# Patient Record
Sex: Female | Born: 1935 | Race: White | Hispanic: No | Marital: Single
Health system: Southern US, Community
[De-identification: ages and names within clinical notes are randomized; demographics above are authoritative.]

---

## 2020-07-03 ENCOUNTER — Emergency Department (HOSPITAL_COMMUNITY): Payer: Self-pay

## 2020-07-03 ENCOUNTER — Encounter (HOSPITAL_COMMUNITY): Payer: Self-pay | Admitting: Emergency Medicine

## 2020-07-03 ENCOUNTER — Observation Stay (HOSPITAL_COMMUNITY)
Admission: EM | Admit: 2020-07-03 | Discharge: 2020-07-05 | Disposition: A | Discharge status: Home / Self Care | Payer: Self-pay | Attending: Internal Medicine | Admitting: Internal Medicine

## 2020-07-03 DIAGNOSIS — R413 Other amnesia: Secondary | ICD-10-CM | POA: Diagnosis present

## 2020-07-03 DIAGNOSIS — Z20822 Contact with and (suspected) exposure to covid-19: Secondary | ICD-10-CM | POA: Insufficient documentation

## 2020-07-03 DIAGNOSIS — F1010 Alcohol abuse, uncomplicated: Secondary | ICD-10-CM | POA: Insufficient documentation

## 2020-07-03 DIAGNOSIS — G454 Transient global amnesia: Principal | ICD-10-CM | POA: Insufficient documentation

## 2020-07-03 DIAGNOSIS — F419 Anxiety disorder, unspecified: Secondary | ICD-10-CM | POA: Insufficient documentation

## 2020-07-03 DIAGNOSIS — F1910 Other psychoactive substance abuse, uncomplicated: Secondary | ICD-10-CM | POA: Insufficient documentation

## 2020-07-03 NOTE — ED Triage Notes (Signed)
Pt came in via EMS with c/o "feeling as if something is not right". Pt states she does not know her name and states "my vagina does not feel right". Pt states that her pants are wet. She states that "everything is a blank right now"

## 2020-07-04 ENCOUNTER — Encounter (HOSPITAL_COMMUNITY): Payer: Self-pay | Admitting: Emergency Medicine

## 2020-07-04 ENCOUNTER — Observation Stay (HOSPITAL_COMMUNITY): Payer: Self-pay

## 2020-07-04 ENCOUNTER — Other Ambulatory Visit: Payer: Self-pay

## 2020-07-04 ENCOUNTER — Emergency Department (HOSPITAL_COMMUNITY): Payer: Self-pay

## 2020-07-04 DIAGNOSIS — E876 Hypokalemia: Secondary | ICD-10-CM

## 2020-07-04 DIAGNOSIS — R413 Other amnesia: Secondary | ICD-10-CM

## 2020-07-04 LAB — I-STAT CHEM 8, ED
BUN: 10 mg/dL (ref 8–23)
Calcium, Ion: 1.08 mmol/L — ABNORMAL LOW (ref 1.15–1.40)
Chloride: 104 mmol/L (ref 98–111)
Creatinine, Ser: 0.8 mg/dL (ref 0.44–1.00)
Glucose, Bld: 101 mg/dL — ABNORMAL HIGH (ref 70–99)
HCT: 38 % (ref 36.0–46.0)
Hemoglobin: 12.9 g/dL (ref 12.0–15.0)
Potassium: 3.3 mmol/L — ABNORMAL LOW (ref 3.5–5.1)
Sodium: 142 mmol/L (ref 135–145)
TCO2: 26 mmol/L (ref 22–32)

## 2020-07-04 LAB — CBC WITH DIFFERENTIAL/PLATELET
Abs Immature Granulocytes: 0.02 10*3/uL (ref 0.00–0.07)
Basophils Absolute: 0.1 10*3/uL (ref 0.0–0.1)
Basophils Relative: 1 %
Eosinophils Absolute: 0.1 10*3/uL (ref 0.0–0.5)
Eosinophils Relative: 2 %
HCT: 36.7 % (ref 36.0–46.0)
Hemoglobin: 12.2 g/dL (ref 12.0–15.0)
Immature Granulocytes: 0 %
Lymphocytes Relative: 27 %
Lymphs Abs: 1.8 10*3/uL (ref 0.7–4.0)
MCH: 32.9 pg (ref 26.0–34.0)
MCHC: 33.2 g/dL (ref 30.0–36.0)
MCV: 98.9 fL (ref 80.0–100.0)
Monocytes Absolute: 0.6 10*3/uL (ref 0.1–1.0)
Monocytes Relative: 8 %
Neutro Abs: 4.3 10*3/uL (ref 1.7–7.7)
Neutrophils Relative %: 62 %
Platelets: 370 10*3/uL (ref 150–400)
RBC: 3.71 MIL/uL — ABNORMAL LOW (ref 3.87–5.11)
RDW: 14.7 % (ref 11.5–15.5)
WBC: 6.9 10*3/uL (ref 4.0–10.5)
nRBC: 0 % (ref 0.0–0.2)

## 2020-07-04 LAB — URINALYSIS, ROUTINE W REFLEX MICROSCOPIC
Bilirubin Urine: NEGATIVE
Glucose, UA: NEGATIVE mg/dL
Hgb urine dipstick: NEGATIVE
Ketones, ur: NEGATIVE mg/dL
Leukocytes,Ua: NEGATIVE
Nitrite: NEGATIVE
Protein, ur: NEGATIVE mg/dL
Specific Gravity, Urine: 1.015 (ref 1.005–1.030)
pH: 9 — ABNORMAL HIGH (ref 5.0–8.0)

## 2020-07-04 LAB — RAPID URINE DRUG SCREEN, HOSP PERFORMED
Amphetamines: NOT DETECTED
Barbiturates: NOT DETECTED
Benzodiazepines: NOT DETECTED
Cocaine: POSITIVE — AB
Opiates: NOT DETECTED
Tetrahydrocannabinol: POSITIVE — AB

## 2020-07-04 LAB — MAGNESIUM: Magnesium: 2.3 mg/dL (ref 1.7–2.4)

## 2020-07-04 LAB — RESP PANEL BY RT-PCR (FLU A&B, COVID) ARPGX2
Influenza A by PCR: NEGATIVE
Influenza B by PCR: NEGATIVE
SARS Coronavirus 2 by RT PCR: NEGATIVE

## 2020-07-04 LAB — ETHANOL: Alcohol, Ethyl (B): 101 mg/dL — ABNORMAL HIGH (ref <10)

## 2020-07-04 LAB — TSH: TSH: 2.73 u[IU]/mL (ref 0.350–4.500)

## 2020-07-04 LAB — ACETAMINOPHEN LEVEL: Acetaminophen (Tylenol), Serum: <10 ug/mL — ABNORMAL LOW (ref 10–30)

## 2020-07-04 LAB — PHOSPHORUS: Phosphorus: 4.3 mg/dL (ref 2.5–4.6)

## 2020-07-04 LAB — VITAMIN B12: Vitamin B-12: 192 pg/mL (ref 180–914)

## 2020-07-04 LAB — SALICYLATE LEVEL: Salicylate Lvl: <7 mg/dL — ABNORMAL LOW (ref 7.0–30.0)

## 2020-07-04 MED ORDER — THIAMINE HCL 100 MG/ML IJ SOLN: 250.0000 mg | Freq: Every day | INTRAVENOUS | Status: DC

## 2020-07-04 MED ORDER — LORAZEPAM 2 MG/ML IJ SOLN
1.0000 mg | INTRAMUSCULAR | Status: DC | PRN
Start: 2020-07-04 — End: 2020-07-05
  Administered 2020-07-04 (×3): 2 mg via INTRAVENOUS
  Administered 2020-07-05 (×2): 1 mg via INTRAVENOUS
  Filled 2020-07-04 (×5): qty 1

## 2020-07-04 MED ORDER — FOLIC ACID 1 MG PO TABS
1.0000 mg | ORAL_TABLET | Freq: Every day | ORAL | Status: DC
  Administered 2020-07-04 – 2020-07-05 (×2): 1 mg via ORAL
  Filled 2020-07-04 (×2): qty 1

## 2020-07-04 MED ORDER — ACETAMINOPHEN 325 MG PO TABS: 650.0000 mg | ORAL_TABLET | ORAL | Status: DC | PRN

## 2020-07-04 MED ORDER — SODIUM CHLORIDE 0.9 % IV SOLN
75.0000 mL/h | INTRAVENOUS | Status: DC
  Administered 2020-07-04 – 2020-07-05 (×2): 75 mL/h via INTRAVENOUS

## 2020-07-04 MED ORDER — THIAMINE HCL 100 MG/ML IJ SOLN: 500.0000 mg | Freq: Three times a day (TID) | INTRAVENOUS | Status: DC

## 2020-07-04 MED ORDER — THIAMINE HCL 100 MG/ML IJ SOLN: 100.0000 mg | Freq: Every day | INTRAMUSCULAR | Status: DC

## 2020-07-04 MED ORDER — SODIUM CHLORIDE 0.9 % IV SOLN: Freq: Once | INTRAVENOUS | Status: AC

## 2020-07-04 MED ORDER — THIAMINE HCL 100 MG/ML IJ SOLN
Freq: Once | INTRAVENOUS | Status: AC
  Filled 2020-07-04: qty 1000

## 2020-07-04 MED ORDER — THIAMINE HCL 100 MG/ML IJ SOLN: Freq: Once | INTRAVENOUS | Status: DC

## 2020-07-04 MED ORDER — ADULT MULTIVITAMIN W/MINERALS CH
1.0000 | ORAL_TABLET | Freq: Every day | ORAL | Status: DC
  Administered 2020-07-04 – 2020-07-05 (×2): 1 via ORAL
  Filled 2020-07-04 (×2): qty 1

## 2020-07-04 MED ORDER — ENOXAPARIN SODIUM 40 MG/0.4ML IJ SOSY
40.0000 mg | PREFILLED_SYRINGE | Freq: Every day | INTRAMUSCULAR | Status: DC
  Administered 2020-07-04 – 2020-07-05 (×2): 40 mg via SUBCUTANEOUS
  Filled 2020-07-04 (×2): qty 0.4

## 2020-07-04 MED ORDER — ACETAMINOPHEN 650 MG RE SUPP: 650.0000 mg | RECTAL | Status: DC | PRN

## 2020-07-04 MED ORDER — LORAZEPAM 1 MG PO TABS
1.0000 mg | ORAL_TABLET | ORAL | Status: DC | PRN
  Administered 2020-07-05: 1 mg via ORAL
  Filled 2020-07-04: qty 1

## 2020-07-04 MED ORDER — POTASSIUM CHLORIDE CRYS ER 20 MEQ PO TBCR
40.0000 meq | EXTENDED_RELEASE_TABLET | Freq: Every day | ORAL | Status: DC
  Administered 2020-07-04: 40 meq via ORAL
  Filled 2020-07-04: qty 2

## 2020-07-04 MED ORDER — ONDANSETRON HCL 4 MG/2ML IJ SOLN: 4.0000 mg | Freq: Four times a day (QID) | INTRAMUSCULAR | Status: DC | PRN

## 2020-07-04 MED ORDER — ONDANSETRON HCL 4 MG PO TABS: 4.0000 mg | ORAL_TABLET | Freq: Four times a day (QID) | ORAL | Status: DC | PRN

## 2020-07-04 MED ORDER — THIAMINE HCL 100 MG PO TABS
100.0000 mg | ORAL_TABLET | Freq: Every day | ORAL | Status: DC
  Administered 2020-07-04 – 2020-07-05 (×3): 100 mg via ORAL
  Filled 2020-07-04 (×3): qty 1

## 2020-07-04 MED ORDER — LEVETIRACETAM IN NACL 1000 MG/100ML IV SOLN
1000.0000 mg | Freq: Once | INTRAVENOUS | Status: AC
  Administered 2020-07-04: 1000 mg via INTRAVENOUS
  Filled 2020-07-04: qty 100

## 2020-07-04 NOTE — Progress Notes (Signed)
CSW working remotely and is currently unable to provide patient with in person substance abuse counseling. CSW added substance abuse resources to patient's AVS.  Edwin Dada, MSW, LCSW Transitions of Care  Clinical Social Worker II [phone]
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
## 2020-07-04 NOTE — ED Provider Notes (Addendum)
[facility]-EMERGENCY DEPT Provider Note   CSN: [id number] Arrival date & time: 07/03/20  2239     History Chief Complaint  Patient presents with  . Altered Mental Status    Jamie Moran is a 87 y.o. female.  The history is provided by the patient and the police. The history is limited by the condition of the patient.  Altered Mental Status Presenting symptoms: memory loss   Presenting symptoms comment:  Does not know name birth date, town, date, president.  She is able to say that this place is a hospital and recognizes objects  Severity:  Severe Most recent episode: unknown duration. Episode history:  Single Timing:  Constant Progression:  Unchanged Chronicity:  New Context comment:  Unknown Associated symptoms: rash   Associated symptoms: no abdominal pain, normal movement, no agitation, no difficulty breathing, no fever, no headaches, no nausea, no slurred speech, no visual change, no vomiting and no weakness   Patient does not know who she is or the town or any dates.  Denies all medical symptoms to me.  Is frustrated that we continue to ask her her name and other questions.       History reviewed. No pertinent past medical history.  There are no problems to display for this patient.   History reviewed. No pertinent surgical history.   OB History   No obstetric history on file.     History reviewed. No pertinent family history.     Home Medications Prior to Admission medications   Not on File    Allergies    Patient has no known allergies.  Review of Systems   Review of Systems  Constitutional: Negative for fever.  HENT: Negative for facial swelling.   Respiratory: Negative for wheezing.   Cardiovascular: Negative for leg swelling.  Gastrointestinal: Negative for abdominal pain, nausea and vomiting.  Musculoskeletal: Negative for neck stiffness.  Skin: Positive for rash.  Neurological: Negative for weakness and  headaches.  Psychiatric/Behavioral: Positive for memory loss. Negative for agitation.    Physical Exam Updated Vital Signs BP (!) 144/86   Pulse 63   Temp 98.9 F (37.2 C) (Oral)   Resp 18   Wt 63.1 kg   SpO2 99%   Physical Exam Vitals and nursing note reviewed.  Constitutional:      Appearance: Normal appearance. She is not ill-appearing or diaphoretic.  HENT:     Head: Normocephalic and atraumatic.     Right Ear: Tympanic membrane normal.     Left Ear: Tympanic membrane normal.     Mouth/Throat:     Mouth: Mucous membranes are moist.     Pharynx: Oropharynx is clear.  Eyes:     Extraocular Movements: Extraocular movements intact.     Conjunctiva/sclera: Conjunctivae normal.     Pupils: Pupils are equal, round, and reactive to light.  Cardiovascular:     Rate and Rhythm: Normal rate and regular rhythm.     Pulses: Normal pulses.     Heart sounds: Normal heart sounds.  Pulmonary:     Effort: Pulmonary effort is normal.     Breath sounds: Normal breath sounds.  Abdominal:     General: Abdomen is flat. Bowel sounds are normal.     Palpations: Abdomen is soft.     Tenderness: There is no abdominal tenderness. There is no guarding.  Musculoskeletal:        General: Normal range of motion.     Cervical back: Normal range of  motion and neck supple. No rigidity.  Lymphadenopathy:     Cervical: No cervical adenopathy.  Skin:    General: Skin is warm and dry.     Capillary Refill: Capillary refill takes less than 2 seconds.       Neurological:     Mental Status: She is alert.     Cranial Nerves: No cranial nerve deficit.     Deep Tendon Reflexes: Reflexes normal.  Psychiatric:        Mood and Affect: Mood normal.     ED Results / Procedures / Treatments   Labs (all labs ordered are listed, but only abnormal results are displayed) Results for orders placed or performed during the hospital encounter of 07/03/20  CBC with Differential/Platelet  Result Value Ref  Range   WBC 6.9 4.0 - 10.5 K/uL   RBC 3.71 (L) 3.87 - 5.11 MIL/uL   Hemoglobin 12.2 12.0 - 15.0 g/dL   HCT 08.1 44.8 - 18.5 %   MCV 98.9 80.0 - 100.0 fL   MCH 32.9 26.0 - 34.0 pg   MCHC 33.2 30.0 - 36.0 g/dL   RDW 63.1 49.7 - 02.6 %   Platelets 370 150 - 400 K/uL   nRBC 0.0 0.0 - 0.2 %   Neutrophils Relative % 62 %   Neutro Abs 4.3 1.7 - 7.7 K/uL   Lymphocytes Relative 27 %   Lymphs Abs 1.8 0.7 - 4.0 K/uL   Monocytes Relative 8 %   Monocytes Absolute 0.6 0.1 - 1.0 K/uL   Eosinophils Relative 2 %   Eosinophils Absolute 0.1 0.0 - 0.5 K/uL   Basophils Relative 1 %   Basophils Absolute 0.1 0.0 - 0.1 K/uL   Immature Granulocytes 0 %   Abs Immature Granulocytes 0.02 0.00 - 0.07 K/uL  I-stat chem 8, ED (not at [facility] or [facility])  Result Value Ref Range   Sodium 142 135 - 145 mmol/L   Potassium 3.3 (L) 3.5 - 5.1 mmol/L   Chloride 104 98 - 111 mmol/L   BUN 10 8 - 23 mg/dL   Creatinine, Ser 3.78 0.44 - 1.00 mg/dL   Glucose, Bld 588 (H) 70 - 99 mg/dL   Calcium, Ion 5.02 (L) 1.15 - 1.40 mmol/L   TCO2 26 22 - 32 mmol/L   Hemoglobin 12.9 12.0 - 15.0 g/dL   HCT 77.4 12.8 - 78.6 %   CT Head Wo Contrast  Result Date: 07/04/2020 CLINICAL DATA:  Delirium EXAM: CT HEAD WITHOUT CONTRAST TECHNIQUE: Contiguous axial images were obtained from the base of the skull through the vertex without intravenous contrast. COMPARISON:  None. FINDINGS: Brain: No evidence of acute infarction, hemorrhage, hydrocephalus, extra-axial collection or mass lesion/mass effect. Vascular: No hyperdense vessel or unexpected calcification. Mild carotid vascular calcification Skull: Normal. Negative for fracture or focal lesion. Sinuses/Orbits: No acute finding. Other: None IMPRESSION: Negative non contrasted CT appearance of the brain Electronically Signed   By: Jasmine Pang M.D.   On: 07/04/2020 00:04   DG Chest Portable 1 View  Result Date: 07/04/2020 CLINICAL DATA:  Altered level of consciousness EXAM: PORTABLE CHEST 1 VIEW  COMPARISON:  None. FINDINGS: The heart size and mediastinal contours are within normal limits. Both lungs are clear. The visualized skeletal structures are unremarkable. IMPRESSION: No active disease. Electronically Signed   By: Sharlet Salina M.D.   On: 07/04/2020 03:46    EKG None  Radiology CT Head Wo Contrast  Result Date: 07/04/2020 CLINICAL DATA:  Delirium EXAM: CT HEAD  WITHOUT CONTRAST TECHNIQUE: Contiguous axial images were obtained from the base of the skull through the vertex without intravenous contrast. COMPARISON:  None. FINDINGS: Brain: No evidence of acute infarction, hemorrhage, hydrocephalus, extra-axial collection or mass lesion/mass effect. Vascular: No hyperdense vessel or unexpected calcification. Mild carotid vascular calcification Skull: Normal. Negative for fracture or focal lesion. Sinuses/Orbits: No acute finding. Other: None IMPRESSION: Negative non contrasted CT appearance of the brain Electronically Signed   By: Jasmine Pang M.D.   On: 07/04/2020 00:04    Procedures Procedures   Medications Ordered in ED Medications  dextrose 5 % and 0.45% NaCl 1,000 mL with thiamine 500 mg, folic acid 1 mg, multivitamins adult 10 mL, magnesium sulfate 2 g infusion (has no administration in time range)  levETIRAcetam (KEPPRA) IVPB 1000 mg/100 mL premix (0 mg Intravenous Stopped 07/04/20 0502)    ED Course  I have reviewed the triage vital signs and the nursing notes.  Pertinent labs & imaging results that were available during my care of the patient were reviewed by me and considered in my medical decision making (see chart for details).   Police performed facial recognition and finger printing and were unable to identify patient.     320 case d/w Dr. Derry Lory.  Admit to [facility], MRI in the am.  Load with 1000 mg of keppra and may need continuous EEG monitoring.     410 case d/w Dr. Loney Loh who will see the patient for admission at [facility]   Final Clinical Impression(s) / ED  Diagnoses Final diagnoses:  Transient global amnesia   Admit to medicine at [facility]   Heru Montz, MD 07/04/20 1607    Nicanor Alcon, Karthika Glasper, MD 07/04/20 3710

## 2020-07-04 NOTE — H&P (Signed)
History and Physical    Jamie Moran AUQ:[id number] DOB: 08/19/1872 DOA: 07/03/2020  PCP: Pcp, No  Patient coming from: streets  Chief Complaint: amnesia  HPI: This patient is a female of undetermined age and medical history. She presents with altered mental status and amnesia. She is unable to tell us her name or what happened prior to her arrival at the hospital. She is aware that she is in [place], [place]. However, she is unable to tell us much more than that. She was found by police in a disheveled state. They tried to perform facial recognition and finger print searches; however, they were unsuccessful in identifying the patient.   ED Course: EDP spoke with neurology. There was concern for possible seizure. She was started on keppra. It was recommended that she get and MRI and EEG at [facility]. [facility] was called for admission.  Review of Systems:  Denies CP, dyspnea, ab pain, palpitations, N/V/D, fever. Review of systems is otherwise negative for all not mentioned in HPI.   PMHx Unable to obtain d/t mentation.  PSHx Unable to obtain d/t mentation.  SocHx Unable to obtain d/t mentation; although her UDS is positive for cocaine and marijuana  No Known Allergies  FamHx Unable to obtain d/t mentation.  Prior to Admission medications   Not on File    Physical Exam: Vitals:   07/04/20 0515 07/04/20 0618 07/04/20 0700 07/04/20 0800  BP: (!) 109/92 133/89 138/80 (!) 150/80  Pulse: 68 67 62 (!) 55  Resp: 19 18 18 16   Temp:      TempSrc:      SpO2: 98% 100% 100% 98%  Weight:        General: 87 y.o. female resting in bed in NAD Eyes: PERRL, normal sclera ENMT: Nares patent w/o discharge, orophaynx clear, dentition normal, ears w/o discharge/lesions/ulcers Neck: Supple, trachea midline Cardiovascular: RRR, +S1, S2, no m/g/r, equal pulses throughout Respiratory: CTABL, no w/r/r, normal WOB GI: BS+, NDNT, no masses noted, no organomegaly noted MSK: No e/c/c Skin: No rashes,  bruises, ulcerations noted Neuro: no focal deficits; alert to city; does not know name or year Psyc: Odd affect, calm/cooperative  Labs on Admission: I have personally reviewed following labs and imaging studies  CBC: Recent Labs  Lab 07/04/20 0030 07/04/20 0036  WBC 6.9  --   NEUTROABS 4.3  --   HGB 12.2 12.9  HCT 36.7 38.0  MCV 98.9  --   PLT 370  --    Basic Metabolic Panel: Recent Labs  Lab 07/04/20 0036  NA 142  K 3.3*  CL 104  GLUCOSE 101*  BUN 10  CREATININE 0.80   GFR: CrCl cannot be calculated (Unknown ideal weight.). Liver Function Tests: No results for input(s): AST, ALT, ALKPHOS, BILITOT, PROT, ALBUMIN in the last 168 hours. No results for input(s): LIPASE, AMYLASE in the last 168 hours. No results for input(s): AMMONIA in the last 168 hours. Coagulation Profile: No results for input(s): INR, PROTIME in the last 168 hours. Cardiac Enzymes: No results for input(s): CKTOTAL, CKMB, CKMBINDEX, TROPONINI in the last 168 hours. BNP (last 3 results) No results for input(s): PROBNP in the last 8760 hours. HbA1C: No results for input(s): HGBA1C in the last 72 hours. CBG: No results for input(s): GLUCAP in the last 168 hours. Lipid Profile: No results for input(s): CHOL, HDL, LDLCALC, TRIG, CHOLHDL, LDLDIRECT in the last 72 hours. Thyroid Function Tests: No results for input(s): TSH, T4TOTAL, FREET4, T3FREE, THYROIDAB in the last 72  hours. Anemia Panel: No results for input(s): VITAMINB12, FOLATE, FERRITIN, TIBC, IRON, RETICCTPCT in the last 72 hours. Urine analysis:    Component Value Date/Time   COLORURINE YELLOW 07/04/2020 0400   APPEARANCEUR CLOUDY (A) 07/04/2020 0400   LABSPEC 1.015 07/04/2020 0400   PHURINE 9.0 (H) 07/04/2020 0400   GLUCOSEU NEGATIVE 07/04/2020 0400   HGBUR NEGATIVE 07/04/2020 0400   BILIRUBINUR NEGATIVE 07/04/2020 0400   KETONESUR NEGATIVE 07/04/2020 0400   PROTEINUR NEGATIVE 07/04/2020 0400   NITRITE NEGATIVE 07/04/2020 0400    LEUKOCYTESUR NEGATIVE 07/04/2020 0400    Radiological Exams on Admission: CT Head Wo Contrast  Result Date: 07/04/2020 CLINICAL DATA:  Delirium EXAM: CT HEAD WITHOUT CONTRAST TECHNIQUE: Contiguous axial images were obtained from the base of the skull through the vertex without intravenous contrast. COMPARISON:  None. FINDINGS: Brain: No evidence of acute infarction, hemorrhage, hydrocephalus, extra-axial collection or mass lesion/mass effect. Vascular: No hyperdense vessel or unexpected calcification. Mild carotid vascular calcification Skull: Normal. Negative for fracture or focal lesion. Sinuses/Orbits: No acute finding. Other: None IMPRESSION: Negative non contrasted CT appearance of the brain Electronically Signed   By: Jasmine Pang M.D.   On: 07/04/2020 00:04   DG Chest Portable 1 View  Result Date: 07/04/2020 CLINICAL DATA:  Altered level of consciousness EXAM: PORTABLE CHEST 1 VIEW COMPARISON:  None. FINDINGS: The heart size and mediastinal contours are within normal limits. Both lungs are clear. The visualized skeletal structures are unremarkable. IMPRESSION: No active disease. Electronically Signed   By: Sharlet Salina M.D.   On: 07/04/2020 03:46    EKG: None obtained in ED.  Assessment/Plan Acute metabolic encephalopathy Amnesia     - place in obs, tele     - neuro onboard, appreciate assistance     - received keppra load; will defer further antiepileptic use to neuro     - MRI ordered     - likely related to polysubstance abuse  Cocaine abuse Marijuana abuse EtOH abuse     - UDS positive for above     - counsel against further use     - not sure of how much EtOH she uses or withdrawal history, will start CIWA  Hypokalemia     - replace K+, check Mg2+  DVT prophylaxis: SCDs  Code Status: FULL  Family Communication: None at bedside  Consults called: EDP spoke with neurology  Status is: Observation  The patient remains OBS appropriate and will d/c before 2  midnights.  Dispo: The patient is from: Unknown              Anticipated d/c is to: Home              Patient currently is not medically stable to d/c.   Difficult to place patient No  Time spent coordinating admission: 60 minutes  [name] A Kamika Goodloe DO [facility]  If 7PM-7AM, please contact night-coverage [email or web address]  07/04/2020, 8:46 AM

## 2020-07-04 NOTE — Progress Notes (Signed)
Paged SANE nurse Traci. She will call me back as soon as she is finished evaluating another patient.Priscille Kluver

## 2020-07-04 NOTE — Discharge Instructions (Signed)
Finding Treatment for Addiction Addiction is a complex disease of the brain that causes an uncontrollable (compulsive) need for:  A substance. This includes alcohol, illegal drugs, or prescription medicines, such as painkillers.  An activity or behavior, such as gambling or shopping. Addiction changes the way your brain works. Because of this change:  The need for the medicine, drug, or activity can become so strong that you think about it all the time.  Getting more and more of your addiction becomes the most important thing to you.  You may find yourself leaving other activities and relationships to pursue your addiction.  You can become physically dependent on a substance.  Your health, behavior, emotions, and relationships can change for the worse. How do I know if I need treatment for addiction? Addiction is a progressive disease. Without treatment, addiction can get worse. Living with addiction puts you at higher risk for injury, poor health, loss of employment, loss of money, and even death. You might need treatment for addiction if:  You have tried to stop or cut down, but you have not succeeded.  You find it annoying that your friends and family are concerned about your use or behavior.  You feel guilty about your use or behavior.  You need a particular substance or activity to start your day or to calm down.  You are running out of money because of your addiction.  You have done something illegal to support your addiction.  Your addiction has caused you: ? Health problems. ? Trouble in school, work, home, or with the police. ? To devote all your time to your addiction, and not to other responsibilities. ? To tell lies in order to hide your problem. What types of treatment are available? There may be options for treatment programs and plans based on your addiction, condition, needs, and preferences. No single treatment is right for everyone.  Treatment programs can  be: ? Outpatient. You live at home and go to work or school, but you go to a clinic for treatment. ? Inpatient. You live and sleep at the program facility during treatment.  Programs may include: ? Medicine. You may need medicine to treat the addiction itself, or to treat anxiety or depression. ? Counseling and behavior therapy. This can help individuals and families behave in healthier ways and relate more effectively. ? Support groups. Confidential group therapy, such as a 12-step program, can help individuals and families during treatment and recovery. ? A combination of education, counseling, and a 12-step, spirituality-based approach.   What should I consider when selecting a treatment program? Think about your individual requirements when selecting a treatment program. Ask about:  The overall approach to treatment. ? Some programs are strictly 12-step programs. Some have a more flexible approach. ? Programs may differ in length of stay, setting, and size. ? Some programs include your family in your treatment plan. Support may be offered to them throughout the treatment process, as well as instructions for them when you are discharged. ? You may continue to receive support after you have left the program.  The types of medical services that are offered. Find out if the program: ? Offers specific treatment for your particular addiction. ? Meets all of your needs, including physical and cultural needs. ? Includes any medicines you might need. ? Offers mental health counseling as part of your treatment. ? Offers the 12-step meetings at the center, or if transport is available for patients to attend meetings at other locations.  The cost and types of insurance that are accepted. ? Some programs are sponsored by the government. They support patients who do not have private insurance. ? If you do not have insurance, or if you choose to attend a program that does not accept your insurance,  call the treatment center. Tell them your financial needs and whether a payment plan can be set up. ? There are also organizations that will help you find the resources for treatment. You can find them online by searching "treatment for addiction."  If the program is certified by the appropriate government agency. Where to find support  Your health care provider can help you to find the right treatment. These discussions are confidential.  The [employer] on Alcoholism and Drug Dependence (NCADD). This group has information about treatment centers and programs for people who have an addiction and for family members. ? Call: 1-800-NCA-CALL ((807)577-1452). ? Visit the website: https://www.ncadd.org/  The Substance Abuse and Mental Health Services Administration [facility]). This organization will help you find publicly funded treatment centers, help hotlines, and counseling services near you. ? Call: 1-800-662-HELP ((954)398-0494). ? Visit the website: www.findtreatment.RockToxic.pl  The National Problem Gambling Helpline. This is a 24-hour confidential helpline for gambling addiction. ? Call: 915 543 5218 ? Visit the website: CocoaInvestor.tn In countries outside of the [place] and [place], look in [employer] for contact information for services in your area. Follow these instructions at home:  Find supportive people who will help you stay away from your addiction and stay sober.  Do not use the substance or engage in the activity.  If you have been through treatment: ? Follow your plan. The plan is usually developed by you and your health care provider during treatment. ? Go to meetings with other people in recovery. ? Avoid people, situations, and things that lead you to do the things you are addicted to (triggers). Summary  Addiction changes the way your brain works. These changes cause a desire to repeat and increase the use of the a substance or  behavior.  Addiction is a progressive disease. Without treatment, addiction can get worse. Living with addiction puts you at higher risk for injury, poor health, loss of employment, loss of money, and even death.  There may be options for treatment programs and plans based on your addiction, condition, needs, and preferences. No single treatment is right for everyone.  Your health care provider can help you to find the right treatment. These discussions are confidential. This information is not intended to replace advice given to you by your health care provider. Make sure you discuss any questions you have with your health care provider. Document Revised: 10/17/2018 Document Reviewed: 02/22/2017 Elsevier Patient Education  2021 [employer].
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
## 2020-07-05 LAB — CBC WITH DIFFERENTIAL/PLATELET
Abs Immature Granulocytes: 0.01 10*3/uL (ref 0.00–0.07)
Basophils Absolute: 0.1 10*3/uL (ref 0.0–0.1)
Basophils Relative: 1 %
Eosinophils Absolute: 0.1 10*3/uL (ref 0.0–0.5)
Eosinophils Relative: 2 %
HCT: 35.6 % — ABNORMAL LOW (ref 36.0–46.0)
Hemoglobin: 11.7 g/dL — ABNORMAL LOW (ref 12.0–15.0)
Immature Granulocytes: 0 %
Lymphocytes Relative: 22 %
Lymphs Abs: 1 10*3/uL (ref 0.7–4.0)
MCH: 32.7 pg (ref 26.0–34.0)
MCHC: 32.9 g/dL (ref 30.0–36.0)
MCV: 99.4 fL (ref 80.0–100.0)
Monocytes Absolute: 0.4 10*3/uL (ref 0.1–1.0)
Monocytes Relative: 9 %
Neutro Abs: 2.9 10*3/uL (ref 1.7–7.7)
Neutrophils Relative %: 66 %
Platelets: 336 10*3/uL (ref 150–400)
RBC: 3.58 MIL/uL — ABNORMAL LOW (ref 3.87–5.11)
RDW: 14.6 % (ref 11.5–15.5)
WBC: 4.5 10*3/uL (ref 4.0–10.5)
nRBC: 0 % (ref 0.0–0.2)

## 2020-07-05 LAB — GLUCOSE, CAPILLARY
Glucose-Capillary: 138 mg/dL — ABNORMAL HIGH (ref 70–99)
Glucose-Capillary: 89 mg/dL (ref 70–99)
Glucose-Capillary: 79 mg/dL (ref 70–99)

## 2020-07-05 LAB — COMPREHENSIVE METABOLIC PANEL
ALT: 11 U/L (ref 0–44)
AST: 12 U/L — ABNORMAL LOW (ref 15–41)
Albumin: 3.1 g/dL — ABNORMAL LOW (ref 3.5–5.0)
Alkaline Phosphatase: 69 U/L (ref 38–126)
Anion gap: 4 — ABNORMAL LOW (ref 5–15)
BUN: 10 mg/dL (ref 8–23)
CO2: 23 mmol/L (ref 22–32)
Calcium: 8.3 mg/dL — ABNORMAL LOW (ref 8.9–10.3)
Chloride: 110 mmol/L (ref 98–111)
Creatinine, Ser: 0.57 mg/dL (ref 0.44–1.00)
GFR, Estimated: >60 mL/min (ref >60)
Glucose, Bld: 89 mg/dL (ref 70–99)
Potassium: 3.9 mmol/L (ref 3.5–5.1)
Sodium: 137 mmol/L (ref 135–145)
Total Bilirubin: 0.4 mg/dL (ref 0.3–1.2)
Total Protein: 5.8 g/dL — ABNORMAL LOW (ref 6.5–8.1)

## 2020-07-05 NOTE — SANE Note (Signed)
On 07/05/2020, at approximately 1130 hours, the SANE/FNE Teacher, music) consult was completed. The primary RN was notified. The patient has declined our services.  The patient did request that if she will be discharged that she would like a bus pass to assist her get back to [facility].
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
## 2020-07-05 NOTE — Progress Notes (Signed)
Ativan 1 mg wasted IV
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
## 2020-07-05 NOTE — SANE Note (Signed)
The SANE/FNE is enroute to see the patient now.

## 2020-07-05 NOTE — Progress Notes (Signed)
Spoke with SANE RN Traci. She will review patient's chart and either speak w/patient or notify day SANE RN. Patient is sleeping after receiving ativan at this time. Will hold off shower tonight in case patient needs to be evaluated. Priscille Kluver
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
## 2020-07-05 NOTE — TOC Initial Note (Signed)
Transition of Care [facility]) - Initial/Assessment Note    Patient Details  Name: Jamie Moran MRN: [id number] Date of Birth: Oct 05, 1872  [facility] CM/SW Contact:    Bartholome Bill, RN Phone Number: 07/05/2020, 1:02 PM  Clinical Narrative:                 Awaiting psych eval. Pt with amnesia memory loss. She provided nursing staff with 2 phone numbers that she remembers. Called both numbers to see if someone could help with identifying her. Left voicemail on the one phone number that had voicemail. TOC will continue to follow.      Activities of Daily Living Home Assistive Devices/Equipment: None ADL Screening (condition at time of admission) Patient's cognitive ability adequate to safely complete daily activities?: Yes Is the patient deaf or have difficulty hearing?: No Does the patient have difficulty seeing, even when wearing glasses/contacts?: No Does the patient have difficulty concentrating, remembering, or making decisions?: Yes Patient able to express need for assistance with ADLs?: No Does the patient have difficulty dressing or bathing?: No Independently performs ADLs?: Yes (appropriate for developmental age) Does the patient have difficulty walking or climbing stairs?: No Weakness of Legs: None Weakness of Arms/Hands: None  Permission Sought/Granted                  Emotional Assessment              Admission diagnosis:  Transient global amnesia [G45.4] Amnesia memory loss [R41.3] Polysubstance abuse (HCC) [F19.10] Patient Active Problem List   Diagnosis Date Noted  . Amnesia memory loss 07/04/2020   PCP:  Pcp, No Pharmacy:  No Pharmacies Listed    Social Determinants of Health (SDOH) Interventions    Readmission Risk Interventions No flowsheet data found.
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
## 2022-09-27 IMAGING — CT CT ABD-PELV W/O CM
2 of 4 series · 17 of 46 positions shown, 19 images · non-contrast
Comparison: None.

CLINICAL DATA: Altered mental status, MRI clearance.

EXAM:
CT ABDOMEN AND PELVIS WITHOUT CONTRAST
TECHNIQUE: Multidetector CT imaging of the abdomen and pelvis was performed
following the standard protocol without IV contrast.

[Series 2: axial st · axial · 0.83mm/px · z∈[-658,-258]mm · 14 of 90 slices shown, 16 images]
[im 5/90  soft-tissue]
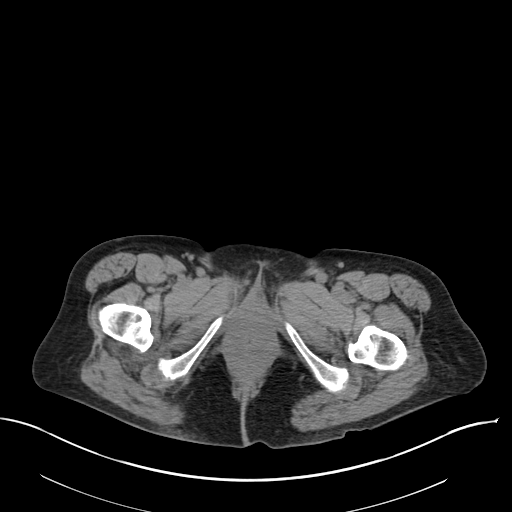
[im 5/90  bone]
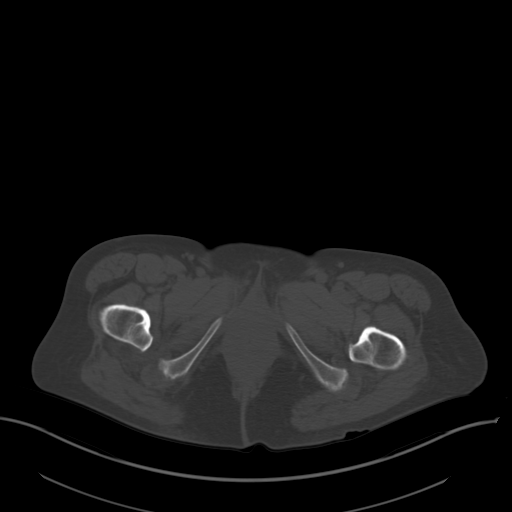
[im 10/90  soft-tissue]
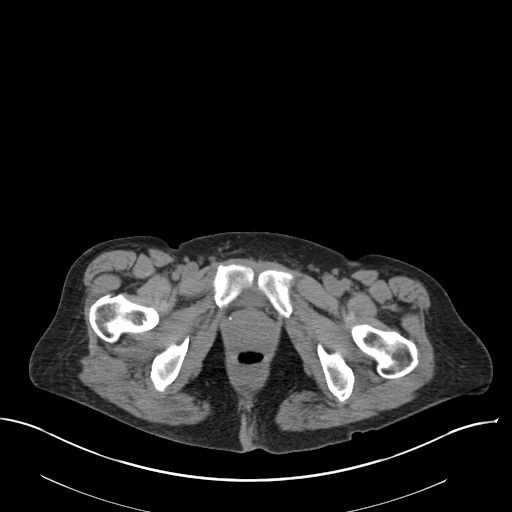
[im 20/90  soft-tissue]
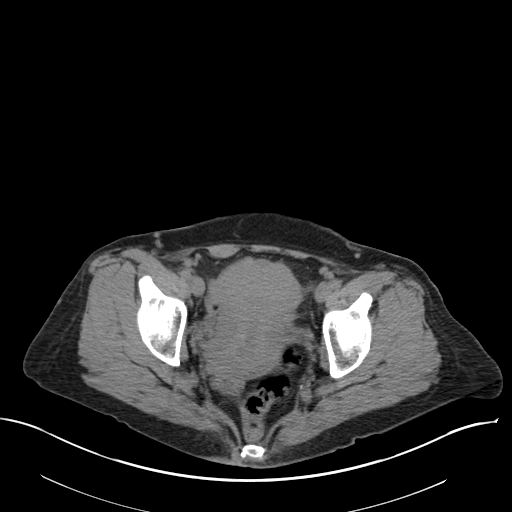
[im 25/90  soft-tissue]
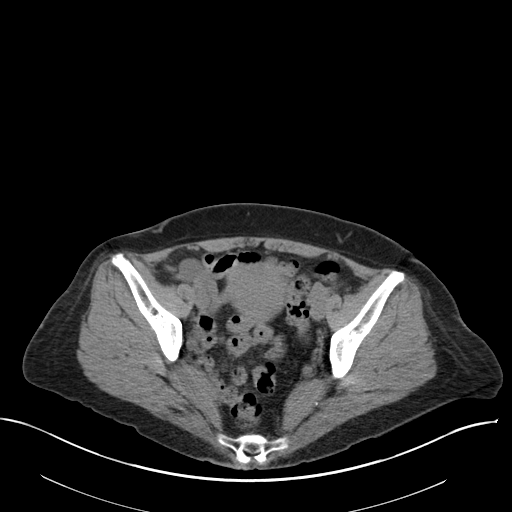
[im 30/90  soft-tissue]
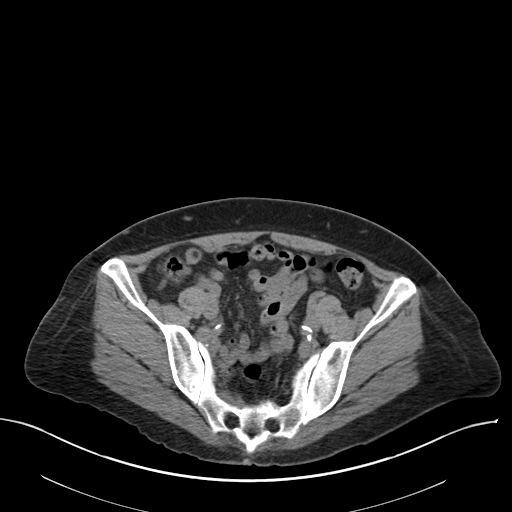
[im 35/90  soft-tissue]
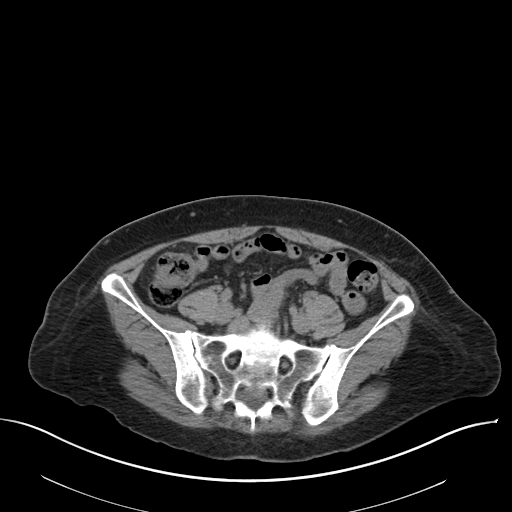
[im 40/90  soft-tissue]
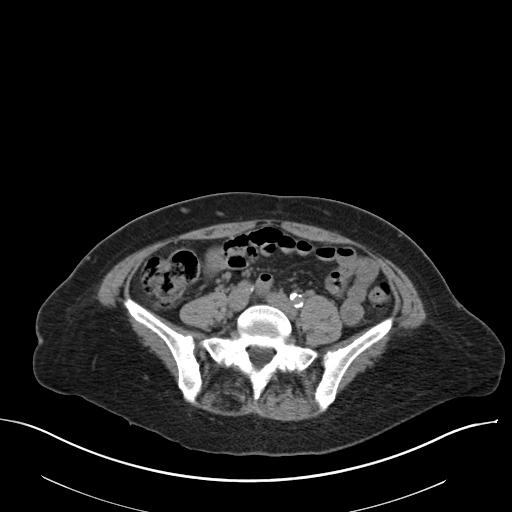
[im 50/90  soft-tissue]
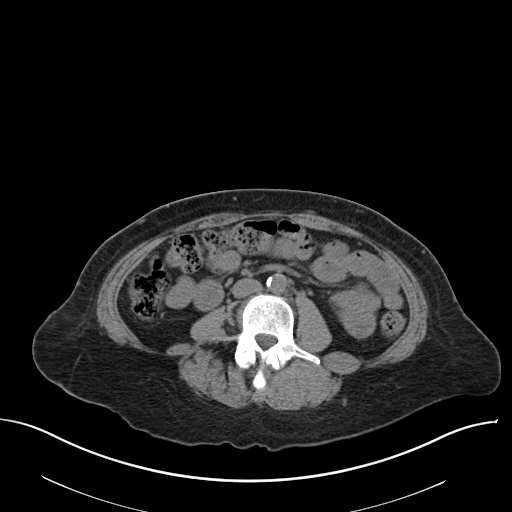
[im 55/90  soft-tissue]
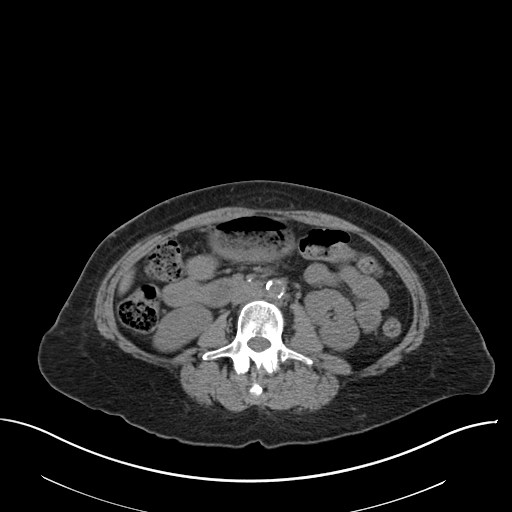
[im 55/90  bone]
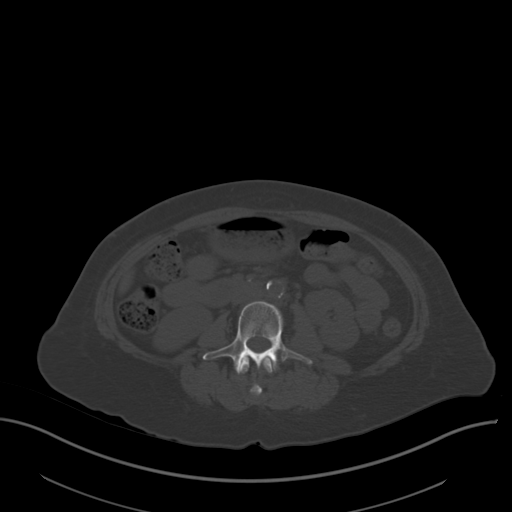
[im 60/90  soft-tissue]
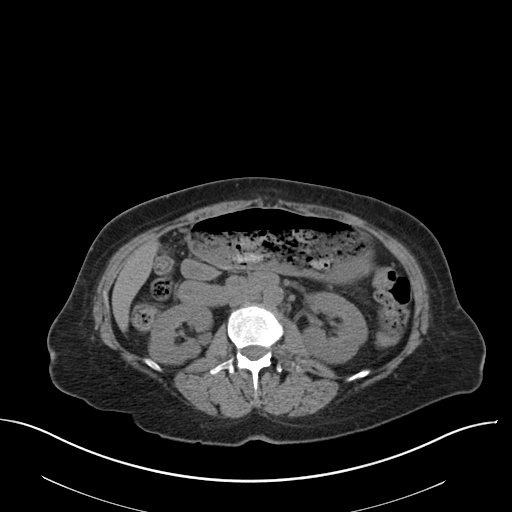
[im 65/90  soft-tissue]
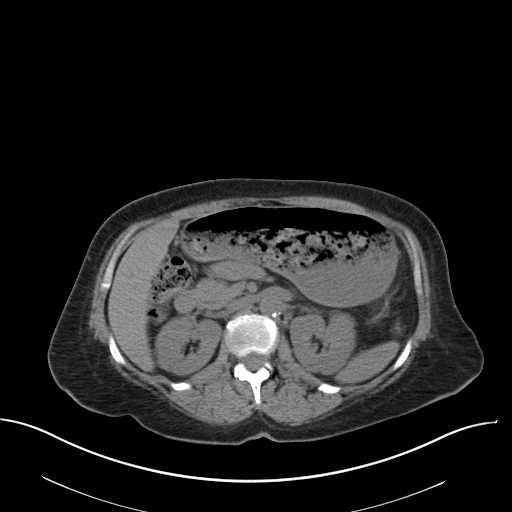
[im 70/90  soft-tissue]
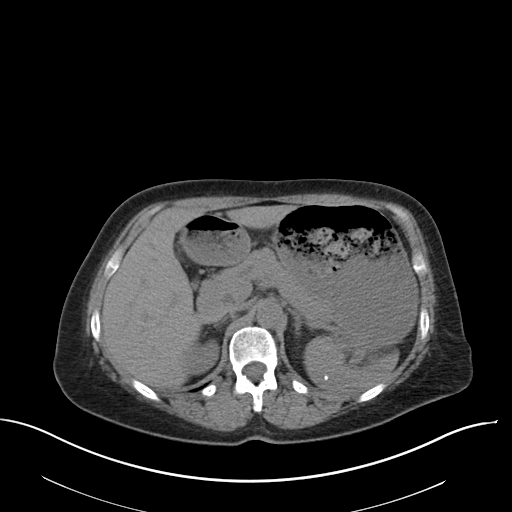
[im 80/90  soft-tissue]
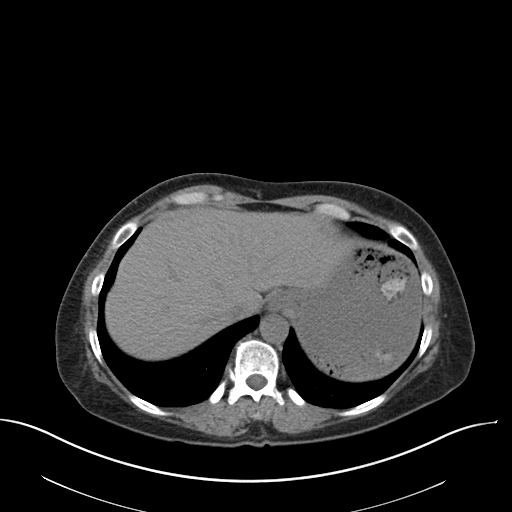
[im 85/90  soft-tissue]
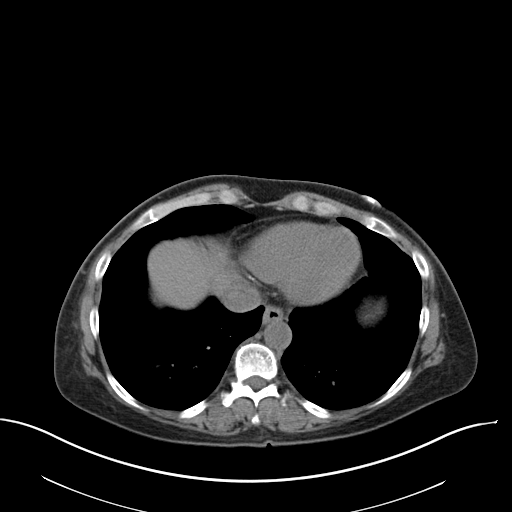

[Series 5: coronal st · coronal · 0.93mm/px · 3 of 137 slices shown]
[im 46/137  soft-tissue]
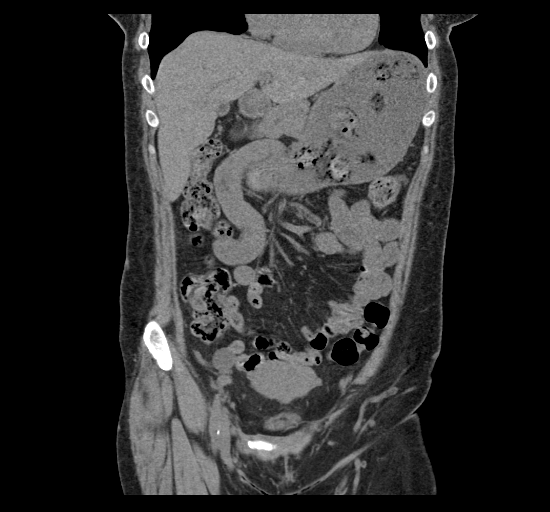
[im 61/137  soft-tissue]
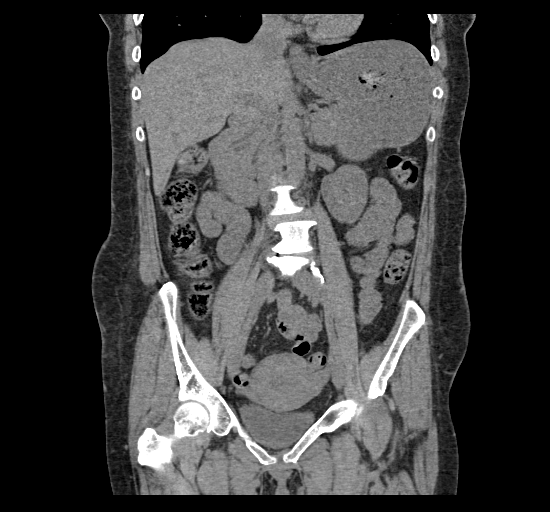
[im 76/137  soft-tissue]
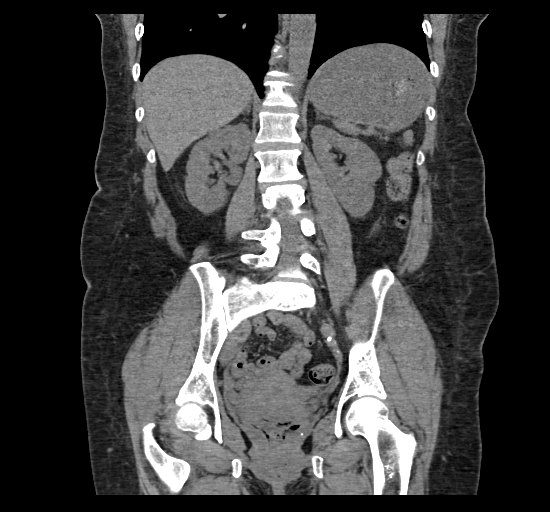

[17 of 46 positions shown; findings below may reference images not displayed]

FINDINGS: Lower chest: No acute abnormality.

Hepatobiliary: No focal liver abnormality is seen. No gallstones,
gallbladder wall thickening, or biliary dilatation.

Pancreas: Unremarkable. No pancreatic ductal dilatation or
surrounding inflammatory changes.

Spleen: Normal size.  Upper pole punctate calcified granuloma.

Adrenals/Urinary Tract: Adrenal glands are unremarkable. Kidneys are
normal, without renal calculi, focal lesion, or hydronephrosis.
Bladder is unremarkable.

Stomach/Bowel: Stomach is distended with enteric contents. Appendix
appears normal. No evidence of bowel wall thickening, distention, or
inflammatory changes.

Vascular/Lymphatic: Aortic atherosclerosis. No enlarged abdominal or
pelvic lymph nodes.

Reproductive: Uterus and bilateral adnexa are unremarkable.

Other: No abdominal wall hernia or abnormality. No abdominopelvic
ascites.

Musculoskeletal: No acute osseous abnormality. Multilevel
degenerative changes of the thoracolumbar spine, most pronounced at
L4-L5.
IMPRESSION: 1. No evidence of metallic foreign bodies within the abdomen or
pelvis.
2. No acute abdominopelvic abnormality.
3.  Aortic Atherosclerosis ([id number]).
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
